# Patient Record
Sex: Female | Born: 1975 | Race: White | Hispanic: No | Marital: Married | Smoking: Never smoker
Health system: Southern US, Community
[De-identification: ages and names within clinical notes are randomized; demographics above are authoritative.]

## PROBLEM LIST (undated history)

## (undated) DIAGNOSIS — B019 Varicella without complication: Secondary | ICD-10-CM

## (undated) DIAGNOSIS — F329 Major depressive disorder, single episode, unspecified: Secondary | ICD-10-CM

## (undated) DIAGNOSIS — N390 Urinary tract infection, site not specified: Secondary | ICD-10-CM

## (undated) DIAGNOSIS — R7611 Nonspecific reaction to tuberculin skin test without active tuberculosis: Secondary | ICD-10-CM

## (undated) DIAGNOSIS — G43909 Migraine, unspecified, not intractable, without status migrainosus: Secondary | ICD-10-CM

## (undated) DIAGNOSIS — F32A Depression, unspecified: Secondary | ICD-10-CM

## (undated) DIAGNOSIS — E785 Hyperlipidemia, unspecified: Secondary | ICD-10-CM

## (undated) DIAGNOSIS — J45909 Unspecified asthma, uncomplicated: Secondary | ICD-10-CM

## (undated) DIAGNOSIS — E7849 Other hyperlipidemia: Principal | ICD-10-CM

## (undated) HISTORY — DX: Major depressive disorder, single episode, unspecified: F32.9

## (undated) HISTORY — DX: Depression, unspecified: F32.A

## (undated) HISTORY — DX: Other hyperlipidemia: E78.49

## (undated) HISTORY — DX: Migraine, unspecified, not intractable, without status migrainosus: G43.909

## (undated) HISTORY — DX: Hyperlipidemia, unspecified: E78.5

## (undated) HISTORY — DX: Varicella without complication: B01.9

## (undated) HISTORY — DX: Nonspecific reaction to tuberculin skin test without active tuberculosis: R76.11

## (undated) HISTORY — DX: Urinary tract infection, site not specified: N39.0

## (undated) HISTORY — PX: HERNIA REPAIR: SHX51

## (undated) HISTORY — DX: Unspecified asthma, uncomplicated: J45.909

---
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
## 2016-03-26 LAB — HEPATIC FUNCTION PANEL
ALK PHOS: 52 (ref 25–125)
ALT: 25 (ref 7–35)
AST: 16 (ref 13–35)
Bilirubin, Total: 0.6

## 2016-03-26 LAB — LIPID PANEL
CHOLESTEROL: 295 — AB (ref 0–200)
HDL: 50 (ref 35–70)
LDL CALC: 219
TRIGLYCERIDES: 132 (ref 40–160)

## 2016-03-26 LAB — CBC AND DIFFERENTIAL
HEMATOCRIT: 39 (ref 36–46)
HEMOGLOBIN: 13.7 (ref 12.0–16.0)
Platelets: 327 (ref 150–399)

## 2016-03-26 LAB — BASIC METABOLIC PANEL
BUN: 17 (ref 4–21)
Creatinine: 0.8 (ref 0.5–1.1)
Glucose: 88
Potassium: 4.6 (ref 3.4–5.3)
Sodium: 140 (ref 137–147)

## 2016-03-26 LAB — HM PAP SMEAR: HM Pap smear: NORMAL

## 2016-03-26 LAB — TSH: TSH: 2.25 (ref 0.41–5.90)

## 2017-09-08 LAB — LIPID PANEL
Cholesterol: 263 — AB (ref 0–200)
HDL: 44 (ref 35–70)
LDL Cholesterol: 191
LDL/HDL RATIO: 6
Triglycerides: 140 (ref 40–160)

## 2017-09-08 LAB — BASIC METABOLIC PANEL: Glucose: 93

## 2017-09-28 ENCOUNTER — Ambulatory Visit (INDEPENDENT_AMBULATORY_CARE_PROVIDER_SITE_OTHER): Payer: 59 | Admitting: Family Medicine

## 2017-09-28 ENCOUNTER — Encounter: Payer: Self-pay | Admitting: *Deleted

## 2017-09-28 ENCOUNTER — Encounter: Payer: Self-pay | Admitting: Family Medicine

## 2017-09-28 VITALS — BP 88/59 | HR 61 | Temp 98.3°F | Resp 20 | Ht 64.0 in | Wt 182.0 lb

## 2017-09-28 DIAGNOSIS — F329 Major depressive disorder, single episode, unspecified: Secondary | ICD-10-CM

## 2017-09-28 DIAGNOSIS — Z0283 Encounter for blood-alcohol and blood-drug test: Secondary | ICD-10-CM | POA: Diagnosis not present

## 2017-09-28 DIAGNOSIS — E669 Obesity, unspecified: Secondary | ICD-10-CM

## 2017-09-28 DIAGNOSIS — Z0289 Encounter for other administrative examinations: Secondary | ICD-10-CM

## 2017-09-28 DIAGNOSIS — Z79899 Other long term (current) drug therapy: Secondary | ICD-10-CM

## 2017-09-28 DIAGNOSIS — F419 Anxiety disorder, unspecified: Secondary | ICD-10-CM | POA: Diagnosis not present

## 2017-09-28 DIAGNOSIS — F32A Depression, unspecified: Secondary | ICD-10-CM | POA: Insufficient documentation

## 2017-09-28 MED ORDER — ALPRAZOLAM 0.25 MG PO TABS: 0.2500 mg | ORAL_TABLET | Freq: Two times a day (BID) | ORAL | 1 refills | Status: AC | PRN

## 2017-09-28 MED ORDER — PAROXETINE HCL 20 MG PO TABS: 20.0000 mg | ORAL_TABLET | Freq: Every day | ORAL | 1 refills | Status: AC

## 2017-09-28 NOTE — Progress Notes (Signed)
Patient ID: Jenny Lopez, female  DOB: September 17, 1975, 42 y.o.   MRN: [id number] Patient Care Team    Relationship Specialty Notifications Start End  Natalia Leatherwood, DO PCP - General Family Medicine  09/28/17     Chief Complaint  Patient presents with  . Establish Care    Subjective:  Jenny Lopez is a 42 y.o.  female present for new patient establishment. All past medical history, surgical history, allergies, family history, immunizations, medications and social history were obtained and entered in the electronic medical record today. All recent labs, ED visits and hospitalizations within the last year were reviewed.  Moved from [place], [place]. She is a Engineer, civil (consulting).   Depression/anxiety: Patient reports she has had depression with anxiety since she was a child.  She suffered from separation anxiety as a kid.  In 2005 her anxiety returned and she had panic attacks.  She reports she was started on Paxil and Xanax at that time.  She also reports trying Lexapro and Ativan at different times.  Did not feel the Ativan was helpful.  She reports she has been between 20 mg and 40 mg of Paxil.  Her trigger time seem to be around the Christmas holidays.  She reports she does not use the Xanax daily.  Mood disorder screening completed today, negative.  Depression screen [facility] 2/9 09/28/2017  Decreased Interest 0  Down, Depressed, Hopeless 0  PHQ - 2 Score 0  Altered sleeping 0  Tired, decreased energy 1  Change in appetite 1  Feeling bad or failure about yourself  0  Trouble concentrating 0  Moving slowly or fidgety/restless 0  Suicidal thoughts 0  PHQ-9 Score 2   GAD 7 : Generalized Anxiety Score 09/28/2017  Nervous, Anxious, on Edge 1  Control/stop worrying 0  Worry too much - different things 0  Trouble relaxing 0  Restless 0  Easily annoyed or irritable 0  Afraid - awful might happen 0  Total GAD 7 Score 1    Current Exercise Habits: The patient does not participate in  regular exercise at present Exercise limited by: None identified No flowsheet data found.   There is no immunization history on file for this patient.  No exam data present  Past Medical History:  Diagnosis Date  . Asthma    Childhood  . Chicken pox   . Depression   . Frequent UTI   . Hyperlipidemia   . Migraines   . Positive PPD, treated 1998   Of skin test, chest x-ray normal.  Pleated six-month prophylactic treatment with INH    Allergies  Allergen Reactions  . Neosporin [Neomycin-Bacitracin Zn-Polymyx] Hives  . Sulfa Antibiotics Hives   Past Surgical History:  Procedure Laterality Date  . HERNIA REPAIR     childhood   Family History  Problem Relation Age of Onset  . Arthritis Mother   . Depression Mother   . Diabetes Mother   . Hyperlipidemia Mother   . Hypertension Mother   . AAA (abdominal aortic aneurysm) Mother   . Arthritis Father   . Diabetes Father   . Heart disease Father   . Hyperlipidemia Father   . Hypertension Father   . Stroke Father   . Diabetes Brother   . Drug abuse Brother   . Heart disease Brother   . Arthritis Maternal Grandmother   . Breast cancer Maternal Grandmother   . Diabetes Maternal Grandmother   . Heart disease Maternal Grandmother   . Hyperlipidemia Maternal  Grandmother   . Hypertension Maternal Grandmother   . Miscarriages / Stillbirths Maternal Grandmother   . Arthritis Maternal Grandfather   . Heart disease Maternal Grandfather   . Hyperlipidemia Maternal Grandfather   . Stroke Maternal Grandfather   . Stroke Paternal Grandmother   . Diabetes Paternal Grandmother   . Heart disease Paternal Grandmother   . Hyperlipidemia Paternal Grandmother   . Heart disease Paternal Grandfather   . Alcohol abuse Brother   . Depression Brother   . Drug abuse Brother   . Cancer Brother    Social History   Socioeconomic History  . Marital status: Married    Spouse name: Not on file  . Number of children: Not on file  . Years  of education: Not on file  . Highest education level: Not on file  Occupational History  . Occupation: Nurse  Social Needs  . Financial resource strain: Not on file  . Food insecurity:    Worry: Not on file    Inability: Not on file  . Transportation needs:    Medical: Not on file    Non-medical: Not on file  Tobacco Use  . Smoking status: Never Smoker  . Smokeless tobacco: Never Used  Substance and Sexual Activity  . Alcohol use: Not Currently    Frequency: Never  . Drug use: Never  . Sexual activity: Yes    Partners: Male    Comment: Married  Lifestyle  . Physical activity:    Days per week: Not on file    Minutes per session: Not on file  . Stress: Not on file  Relationships  . Social connections:    Talks on phone: Not on file    Gets together: Not on file    Attends religious service: Not on file    Active member of club or organization: Not on file    Attends meetings of clubs or organizations: Not on file    Relationship status: Not on file  . Intimate partner violence:    Fear of current or ex partner: Not on file    Emotionally abused: Not on file    Physically abused: Not on file    Forced sexual activity: Not on file  Other Topics Concern  . Not on file  Social History Narrative   Married.  One child.   Works as an Charity fundraiser.   She is herbal remedies.   Smoke alarm in the home.   Wears her seatbelt.   Feels safe in her relationships.   Allergies as of 09/28/2017      Reactions   Neosporin [neomycin-bacitracin Zn-polymyx] Hives   Sulfa Antibiotics Hives      Medication List        Accurate as of 09/28/17 11:59 PM. Always use your most recent med list.          ALPRAZolam 0.25 MG tablet Commonly known as:  XANAX Take 1 tablet (0.25 mg total) by mouth 2 (two) times daily as needed.   dicyclomine 10 MG capsule Commonly known as:  BENTYL Take 10 mg by mouth as needed for spasms.   multivitamin tablet Take 1 tablet by mouth daily.   PARoxetine 20  MG tablet Commonly known as:  PAXIL Take 1 tablet (20 mg total) by mouth daily.   PROBIOTIC ADVANCED PO Take 3 tablets by mouth daily.   Vitamin D3 1000 units Caps Take 1 capsule by mouth daily.       All past medical history, surgical  history, allergies, family history, immunizations andmedications were updated in the EMR today and reviewed under the history and medication portions of their EMR.     Patient was never admitted.   ROS: 14 pt review of systems performed and negative (unless mentioned in an HPI)  Objective: BP (!) 88/59 (BP Location: Right Arm, Patient Position: Sitting, Cuff Size: Normal)   Pulse 61   Temp 98.3 F (36.8 C)   Resp 20   Ht  (1.626 m)   Wt 182 lb (82.6 kg)   LMP 09/25/2017   SpO2 97%   BMI 31.24 kg/m  Gen: Afebrile. No acute distress. Nontoxic in appearance, well-developed, well-nourished, pleasant Caucasian female. HENT: AT. [place]. MMM Eyes:Pupils Equal Round Reactive to light, Extraocular movements intact,  Conjunctiva without redness, discharge or icterus. CV: RRR no murmur, no edema Chest: CTAB, no wheeze, rhonchi or crackles.  Abd: Soft.NTND. BS present.  Skin: Warm and well-perfused. Skin intact. Neuro/Msk:  Normal gait. PERLA. EOMi. Alert. Oriented x3.   Psych: Normal affect, dress and demeanor. Normal speech. Normal thought content and judgment.   Assessment/plan: Breshae Belcher is a 42 y.o. female present for establish care. Anxiety and depression -Discussed patient's therapy in detail.  She feels she would like to decrease her Paxil to 20 mg daily.  This was completed today with refills. -Discussed controlled substance with patient today.  I agreed to provide/continue low dose Xanax for her, since she has been on this for quite some time.  Xanax will not be increased, maximal SSRI therapy will be achieved before increasing benzodiazepine. -Patient signed a controlled substance contract today.  [place] controlled substance  database was reviewed and appropriate.  Urine drug screen was completed. - Pain Mgmt, Profile 8 w/Conf, U Follow-up 6 months   Encounter for drug screening/Benzodiazepine contract exists Drug screen completed secondary to use of controlled substance.  Obesity (BMI 30-39.9) Diet and exercise modifications.  Return in about 6 months (around 03/30/2018).   Note is dictated utilizing voice recognition software. Although note has been proof read prior to signing, occasional typographical errors still can be missed. If any questions arise, please do not hesitate to call for verification.  Electronically signed by: Felix Pacini, DO [facility]

## 2017-09-28 NOTE — Patient Instructions (Addendum)
It was nice to me you today.  I have refilled your paxil today at the 20 dose--> you need to take daily.    Controlled substance require face to face encounter every 6 months with this provider only  Please help [place] help you:  We are honored you have chosen [facility] for your Primary Care home. Below you will find basic instructions that you may need to access in the future. Please help [place] help you by reading the instructions, which cover many of the frequent questions we experience.   Prescription refills and request:  -In order to allow more efficient response time, please call your pharmacy for all refills. They will forward the request electronically to [place]. This allows for the quickest possible response. Request left on a nurse line can take longer to refill, since these are checked as time allows between office patients and other phone calls.  - refill request can take up to 3-5 working days to complete.  - If request is sent electronically and request is appropiate, it is usually completed in 1-2 business days.  - all patients will need to be seen routinely for all chronic medical conditions requiring prescription medications (see follow-up below). If you are overdue for follow up on your condition, you will be asked to make an appointment and we will call in enough medication to cover you until your appointment (up to 30 days).  - all controlled substances will require a face to face visit to request/refill.  - if you desire your prescriptions to go through a new pharmacy, and have an active script at original pharmacy, you will need to call your pharmacy and have scripts transferred to new pharmacy. This is completed between the pharmacy locations and not by your provider.    Results: If any images or labs were ordered, it can take up to 1 week to get results depending on the test ordered and the lab/facility running and resulting the test. - Normal or stable results, which do not  need further discussion, may be released to your mychart immediately with attached note to you. A call may not be generated for normal results. Please make certain to sign up for mychart. If you have questions on how to activate your mychart you can call the front office.  - If your results need further discussion, our office will attempt to contact you via phone, and if unable to reach you after 2 attempts, we will release your abnormal result to your mychart with instructions.  - All results will be automatically released in mychart after 1 week.  - Your provider will provide you with explanation and instruction on all relevant material in your results. Please keep in mind, results and labs may appear confusing or abnormal to the untrained eye, but it does not mean they are actually abnormal for you personally. If you have any questions about your results that are not covered, or you desire more detailed explanation than what was provided, you should make an appointment with your provider to do so.   Our office handles many outgoing and incoming calls daily. If we have not contacted you within 1 week about your results, please check your mychart to see if there is a message first and if not, then contact our office.  In helping with this matter, you help decrease call volume, and therefore allow [place] to be able to respond to patients needs more efficiently.   Acute office visits (sick visit):  An acute visit is intended for a new problem and are scheduled in shorter time slots to allow schedule openings for patients with new problems. This is the appropriate visit to discuss a new problem. In order to provide you with excellent quality medical care with proper time for you to explain your problem, have an exam and receive treatment with instructions, these appointments should be limited to one new problem per visit. If you experience a new problem, in which you desire to be addressed, please make an acute  office visit, we save openings on the schedule to accommodate you. Please do not save your new problem for any other type of visit, let us take care of it properly and quickly for you.   Follow up visits:  Depending on your condition(s) your provider will need to see you routinely in order to provide you with quality care and prescribe medication(s). Most chronic conditions (Example: hypertension, Diabetes, depression/anxiety... etc), require visits a couple times a year. Your provider will instruct you on proper follow up for your personal medical conditions and history. Please make certain to make follow up appointments for your condition as instructed. Failing to do so could result in lapse in your medication treatment/refills. If you request a refill, and are overdue to be seen on a condition, we will always provide you with a 30 day script (once) to allow you time to schedule.    Medicare wellness (well visit): - we have a wonderful Nurse Selena Batten), that will meet with you and provide you will yearly medicare wellness visits. These visits should occur yearly (can not be scheduled less than 1 calendar year apart) and cover preventive health, immunizations, advance directives and screenings you are entitled to yearly through your medicare benefits. Do not miss out on your entitled benefits, this is when medicare will pay for these benefits to be ordered for you.  These are strongly encouraged by your provider and is the appropriate type of visit to make certain you are up to date with all preventive health benefits. If you have not had your medicare wellness exam in the last 12 months, please make certain to schedule one by calling the office and schedule your medicare wellness with Selena Batten as soon as possible.   Yearly physical (well visit):  - Adults are recommended to be seen yearly for physicals. Check with your insurance and date of your last physical, most insurances require one calendar year between  physicals. Physicals include all preventive health topics, screenings, medical exam and labs that are appropriate for gender/age and history. You may have fasting labs needed at this visit. This is a well visit (not a sick visit), new problems should not be covered during this visit (see acute visit).  - Pediatric patients are seen more frequently when they are younger. Your provider will advise you on well child visit timing that is appropriate for your their age. - This is not a medicare wellness visit. Medicare wellness exams do not have an exam portion to the visit. Some medicare companies allow for a physical, some do not allow a yearly physical. If your medicare allows a yearly physical you can schedule the medicare wellness with our nurse Selena Batten and have your physical with your provider after, on the same day. Please check with insurance for your full benefits.   Late Policy/No Shows:  - all new patients should arrive 15-30 minutes earlier than appointment to allow [place] time  to  obtain all personal demographics,  insurance information and for you to complete office paperwork. - All established patients should arrive 10-15 minutes earlier than appointment time to update all information and be checked in .  - In our best efforts to run on time, if you are late for your appointment you will be asked to either reschedule or if able, we will work you back into the schedule. There will be a wait time to work you back in the schedule,  depending on availability.  - If you are unable to make it to your appointment as scheduled, please call 24 hours ahead of time to allow [place] to fill the time slot with someone else who needs to be seen. If you do not cancel your appointment ahead of time, you may be charged a no show fee.

## 2017-09-30 ENCOUNTER — Encounter: Payer: Self-pay | Admitting: Family Medicine

## 2017-09-30 DIAGNOSIS — E669 Obesity, unspecified: Secondary | ICD-10-CM | POA: Insufficient documentation

## 2017-10-01 LAB — PAIN MGMT, PROFILE 8 W/CONF, U
6 Acetylmorphine: NEGATIVE ng/mL (ref <10)
ALPHAHYDROXYALPRAZOLAM: 25 ng/mL — AB (ref <25)
AMINOCLONAZEPAM: NEGATIVE ng/mL (ref <25)
Alcohol Metabolites: NEGATIVE ng/mL (ref <500)
Alphahydroxymidazolam: NEGATIVE ng/mL (ref <50)
Alphahydroxytriazolam: NEGATIVE ng/mL (ref <50)
Amphetamines: NEGATIVE ng/mL (ref <500)
BENZODIAZEPINES: POSITIVE ng/mL — AB (ref <100)
BUPRENORPHINE, URINE: NEGATIVE ng/mL (ref <5)
Cocaine Metabolite: NEGATIVE ng/mL (ref <150)
Hydroxyethylflurazepam: NEGATIVE ng/mL (ref <50)
Lorazepam: NEGATIVE ng/mL (ref <50)
MDMA: NEGATIVE ng/mL (ref <500)
Marijuana Metabolite: NEGATIVE ng/mL (ref <20)
Nordiazepam: NEGATIVE ng/mL (ref <50)
OXIDANT: NEGATIVE ug/mL (ref <200)
Opiates: NEGATIVE ng/mL (ref <100)
Oxazepam: NEGATIVE ng/mL (ref <50)
Oxycodone: NEGATIVE ng/mL (ref <100)
PH: 6.68 (ref 4.5–9.0)
TEMAZEPAM: NEGATIVE ng/mL (ref <50)

## 2017-12-15 ENCOUNTER — Encounter: Payer: Self-pay | Admitting: Family Medicine

## 2017-12-20 ENCOUNTER — Encounter: Payer: Self-pay | Admitting: Family Medicine

## 2018-01-10 ENCOUNTER — Ambulatory Visit
Admission: RE | Admit: 2018-01-10 | Discharge: 2018-01-10 | Disposition: A | Discharge status: Home / Self Care | Payer: 59 | Source: Ambulatory Visit | Attending: Internal Medicine | Admitting: Internal Medicine

## 2018-01-10 ENCOUNTER — Other Ambulatory Visit: Payer: Self-pay | Admitting: Internal Medicine

## 2018-01-10 DIAGNOSIS — R7611 Nonspecific reaction to tuberculin skin test without active tuberculosis: Secondary | ICD-10-CM

## 2018-02-15 ENCOUNTER — Other Ambulatory Visit: Payer: Self-pay | Admitting: Internal Medicine

## 2018-02-15 DIAGNOSIS — Z1231 Encounter for screening mammogram for malignant neoplasm of breast: Secondary | ICD-10-CM

## 2018-02-28 ENCOUNTER — Ambulatory Visit
Admission: RE | Admit: 2018-02-28 | Discharge: 2018-02-28 | Disposition: A | Discharge status: Home / Self Care | Payer: 59 | Source: Ambulatory Visit | Attending: Internal Medicine | Admitting: Internal Medicine

## 2018-02-28 DIAGNOSIS — Z1231 Encounter for screening mammogram for malignant neoplasm of breast: Secondary | ICD-10-CM

## 2018-03-02 ENCOUNTER — Ambulatory Visit (INDEPENDENT_AMBULATORY_CARE_PROVIDER_SITE_OTHER): Payer: 59 | Admitting: Cardiovascular Disease

## 2018-03-02 ENCOUNTER — Encounter: Payer: Self-pay | Admitting: Cardiovascular Disease

## 2018-03-02 VITALS — BP 118/68 | HR 69 | Ht 64.0 in | Wt 177.0 lb

## 2018-03-02 DIAGNOSIS — Z136 Encounter for screening for cardiovascular disorders: Secondary | ICD-10-CM | POA: Diagnosis not present

## 2018-03-02 DIAGNOSIS — E7849 Other hyperlipidemia: Secondary | ICD-10-CM | POA: Insufficient documentation

## 2018-03-02 HISTORY — DX: Other hyperlipidemia: E78.49

## 2018-03-02 NOTE — Progress Notes (Signed)
Cardiology Office Note   Date:  03/05/2018   ID:  Jenny Lopez, DOB Dec 18, 1975, MRN [id number]  PCP:  Jenny Ranch, MD  Cardiologist:   Jenny Si, MD   No chief complaint on file.     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  Jenny Lopez has known that Jenny Lopez had hyperlipidemia since her 39s. Jenny Lopez was able to control it with diet and exercise in her 20s.  Jenny Lopez thinks here LDL was in the low 100s.  However, recently Jenny Lopez hasn't been exercising regularly or following any particular diet.  Jenny Lopez has ben trying to limit sweets.  Jenny Lopez previously participated in [employer], swam and lifted weights.  Jenny Lopez has no exertional chest pain or shortness of breath.  Jenny Lopez hasn't experienced any lower extremity edema, orthopnea or PND.  Jenny Lopez has an extensive family history of atherosclerotic disease.  Her mother smoked but has an ascending aorta aneurysm, abdominal aorta aneurysm, and occlusion of the subclavian artery. A maternal aunt survived a ruptured AAA.  Her father cad carotid disease , heart disease and a stroke.  Jenny Lopez has recommended that Jenny Lopez be on a statin given her FH and extensive family history of CV disease but Jenny Lopez has declined.    Past Medical History:  Diagnosis Date  . Asthma    Childhood  . Chicken pox   . Depression   . Familial hyperlipidemia 03/02/2018  . Frequent UTI   . Hyperlipidemia   . Migraines   . Positive PPD, treated 1998   Of skin test, chest x-ray normal.  Pleated six-month prophylactic treatment with INH     Past Surgical History:  Procedure Laterality Date  . HERNIA REPAIR     childhood     Current Outpatient Medications  Medication Sig Dispense Refill  . ALPRAZolam (XANAX) 0.25 MG tablet Take 1 tablet (0.25 mg total) by mouth 2 (two) times daily as needed. 30 tablet 1  . Cholecalciferol (VITAMIN D3) 1000 units CAPS Take 1  capsule by mouth daily.    Marland Kitchen dicyclomine (BENTYL) 10 MG capsule Take 10 mg by mouth as needed for spasms.    . Multiple Vitamin (MULTIVITAMIN) tablet Take 1 tablet by mouth daily.    Marland Kitchen PARoxetine (PAXIL) 20 MG tablet Take 1 tablet (20 mg total) by mouth daily. 90 tablet 1  . Probiotic Product (PROBIOTIC ADVANCED PO) Take 3 tablets by mouth daily.     No current facility-administered medications for this visit.     Allergies:   Neosporin [neomycin-bacitracin zn-polymyx] and Sulfa antibiotics    Social History:  The patient  reports that Jenny Lopez has never smoked. Jenny Lopez has never used smokeless tobacco. Jenny Lopez reports that Jenny Lopez drank alcohol. Jenny Lopez reports that Jenny Lopez does not use drugs.   Family History:  The patient's family history includes AAA (abdominal aortic aneurysm) in her mother; Alcohol abuse in her brother; Arthritis in her father, maternal grandfather, maternal grandmother, and mother; Breast cancer in her maternal grandmother; Cancer in her brother; Depression in her brother and mother; Diabetes in her brother, father, maternal grandmother, mother, and paternal grandmother; Drug abuse in her brother and brother; Heart disease in her brother, father, maternal grandfather, maternal grandmother, paternal grandfather, and paternal grandmother; Hyperlipidemia in her father, maternal grandfather, maternal grandmother, mother, and paternal grandmother; Hypertension in her father, maternal grandmother, and mother; Miscarriages / [place] in her maternal grandmother;  Stroke in her father, maternal grandfather, and paternal grandmother.    ROS:  Please see the history of present illness.   Otherwise, review of systems are positive for none.   All other systems are reviewed and negative.    PHYSICAL EXAM: VS:  BP 118/68   Pulse 69   Ht 5\' 4"  (1.626 m)   Wt 177 lb (80.3 kg)   LMP 02/14/2018   SpO2 98%   BMI 30.38 kg/m  , BMI Body mass index is 30.38 kg/m. GENERAL:  Well appearing HEENT:  Pupils  equal round and reactive, fundi not visualized, oral mucosa unremarkable NECK:  No jugular venous distention, waveform within normal limits, carotid upstroke brisk and symmetric, no bruits LUNGS:  Clear to auscultation bilaterally HEART:  RRR.  PMI not displaced or sustained,S1 and S2 within normal limits, no S3, no S4, no clicks, no rubs, no murmurs ABD:  Flat, positive bowel sounds normal in frequency in pitch, no bruits, no rebound, no guarding, no midline pulsatile mass, no hepatomegaly, no splenomegaly EXT:  2 plus pulses throughout, no edema, no cyanosis no clubbing SKIN:  No rashes no nodules NEURO:  Cranial nerves II through XII grossly intact, motor grossly intact throughout PSYCH:  Cognitively intact, oriented to person place and time   EKG:  EKG is ordered today. The ekg ordered today demonstrates sinus rhythm.  Rate 71 bpm.     Recent Labs: No results found for requested labs within last 8760 hours.   01/10/2018: Total cholesterol 258, triglycerides 121, HDL 34, LDL 200 Potassium 4.5, creatinine 0.7 Hemoglobin 13.5 TSH 1.26  Lipid Panel    Component Value Date/Time   CHOL 263 (A) 09/08/2017   TRIG 140 09/08/2017   HDL 44 09/08/2017   LDLCALC 191 09/08/2017      Wt Readings from Last 3 Encounters:  03/02/18 177 lb (80.3 kg)  09/28/17 182 lb (82.6 kg)      ASSESSMENT AND PLAN:  # Familial hyperlipidemia:  Jenny Lopez has an LDL of 200 and many family members with extensive premature atherosclerotic disease.  Jenny Lopez has been hesitant to try statins due to concern about developing diabetes.  We discussed the pros and cons extensively.  We discussed the comparative risk of developing diabetes from a statin with the risk of MI, stroke of death.  I am in agreement with Jenny Lopez that guidelines would absolutely recommend Jenny Lopez be on a statin for lipid lowering.  Her LDL should be at least <100.  I doubt that Jenny Lopez can do this with diet and exercise alone and this would  require consistent effort over time.  Jenny Lopez has neither been exercising nor dieting.  Jenny Lopez is committed to working on this for the next 4 months and we will reassess.  Repeat lipids/CMP   # FH AAA: Jenny Lopez only risk factors are hyperlipidemia and family history.  Jenny Lopez wants to get a triple screen (abdominal ultrasound, carotid Dopplers and ABIs).  Lipid management as above.   Current medicines are reviewed at length with the patient today.  The patient does not have concerns regarding medicines.  The following changes have been made:  no change    Disposition:   FU with Kwamaine Cuppett C. Duke Salvia, MD, [facility] in 4 months.   Time spent: 45 minutes-Greater than 50% of this time was spent in counseling, explanation of diagnosis, planning of further management, and coordination of care.      Signed, Buddy Loeffelholz C. Duke Salvia, MD, [facility]  03/05/2018 6:47 PM  [facility]

## 2018-03-02 NOTE — Patient Instructions (Addendum)
Medication Instructions:  Your physician recommends that you continue on your current medications as directed. Please refer to the Current Medication list given to you today.  Labwork: FASTING LP/CMET FEW DAYS PRIOR TO FOLLOW UP   Testing/Procedures: VASCULAR SCREENING $150 OUT OF POCKET   Follow-Up: Your physician recommends that you schedule a follow-up appointment in: 3 MONTHS  If you need a refill on your cardiac medications before your next appointment, please call your pharmacy.

## 2018-03-05 ENCOUNTER — Encounter: Payer: Self-pay | Admitting: Cardiovascular Disease

## 2018-03-07 ENCOUNTER — Ambulatory Visit (HOSPITAL_COMMUNITY): Payer: 59

## 2018-03-08 ENCOUNTER — Ambulatory Visit (HOSPITAL_COMMUNITY)
Admission: RE | Admit: 2018-03-08 | Discharge: 2018-03-08 | Disposition: A | Discharge status: Home / Self Care | Payer: 59 | Source: Ambulatory Visit | Attending: Cardiovascular Disease | Admitting: Cardiovascular Disease

## 2018-03-08 DIAGNOSIS — E7849 Other hyperlipidemia: Secondary | ICD-10-CM | POA: Insufficient documentation

## 2018-03-08 DIAGNOSIS — Z136 Encounter for screening for cardiovascular disorders: Secondary | ICD-10-CM | POA: Insufficient documentation

## 2018-05-26 ENCOUNTER — Ambulatory Visit: Payer: 59 | Admitting: Cardiovascular Disease

## 2019-01-27 ENCOUNTER — Other Ambulatory Visit: Payer: Self-pay | Admitting: Internal Medicine

## 2019-01-27 DIAGNOSIS — Z1231 Encounter for screening mammogram for malignant neoplasm of breast: Secondary | ICD-10-CM

## 2019-03-14 ENCOUNTER — Ambulatory Visit
Admission: RE | Admit: 2019-03-14 | Discharge: 2019-03-14 | Disposition: A | Discharge status: Home / Self Care | Payer: 59 | Source: Ambulatory Visit | Attending: Internal Medicine | Admitting: Internal Medicine

## 2019-03-14 ENCOUNTER — Other Ambulatory Visit: Payer: Self-pay

## 2019-03-14 DIAGNOSIS — Z1231 Encounter for screening mammogram for malignant neoplasm of breast: Secondary | ICD-10-CM

## 2019-09-13 IMAGING — MG DIGITAL SCREENING BILATERAL MAMMOGRAM WITH TOMO AND CAD
8 series · 9 of 24 positions shown · non-contrast
Comparison: None.

CLINICAL DATA: Screening.

EXAM:
DIGITAL SCREENING BILATERAL MAMMOGRAM WITH TOMO AND CAD

[L CC synth-2D]
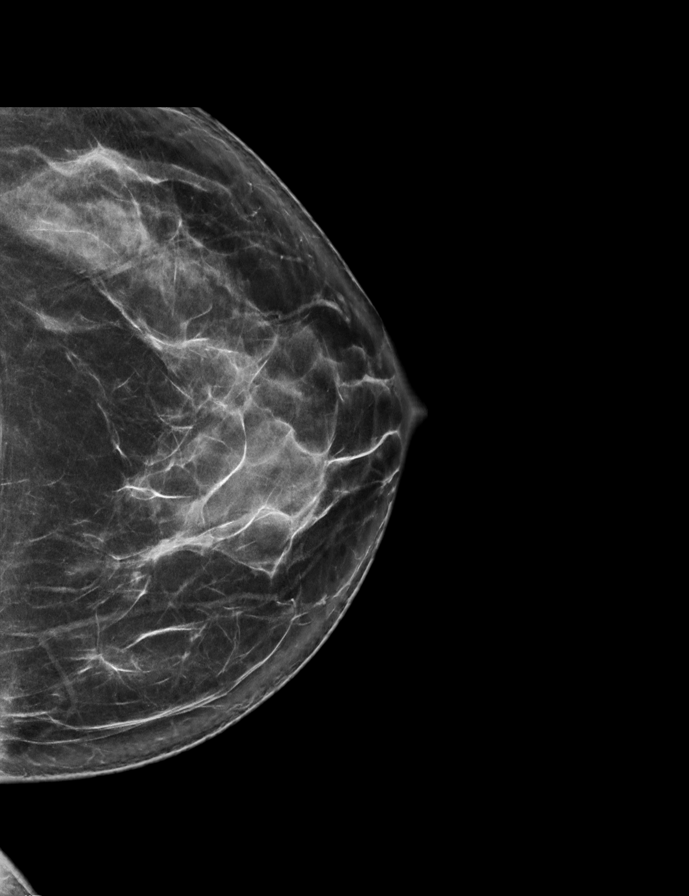

[R MLO synth-2D]
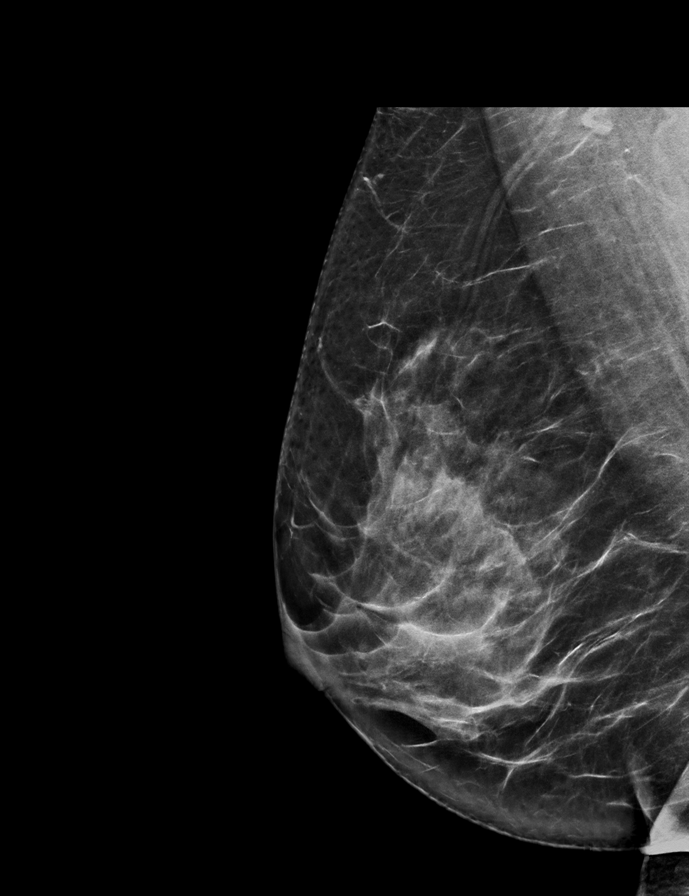

[L MLO synth-2D]
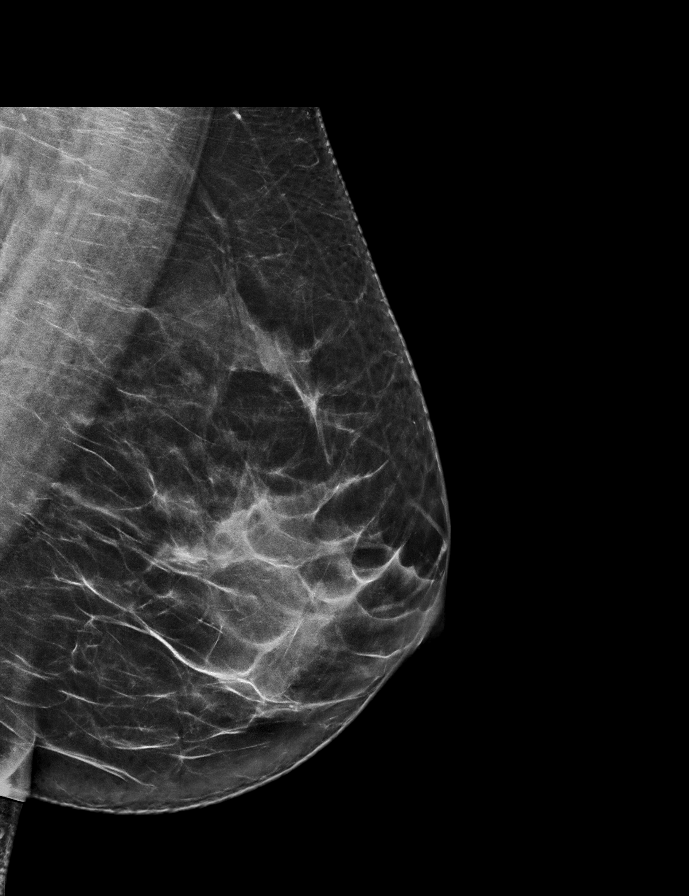

[R CC synth-2D]
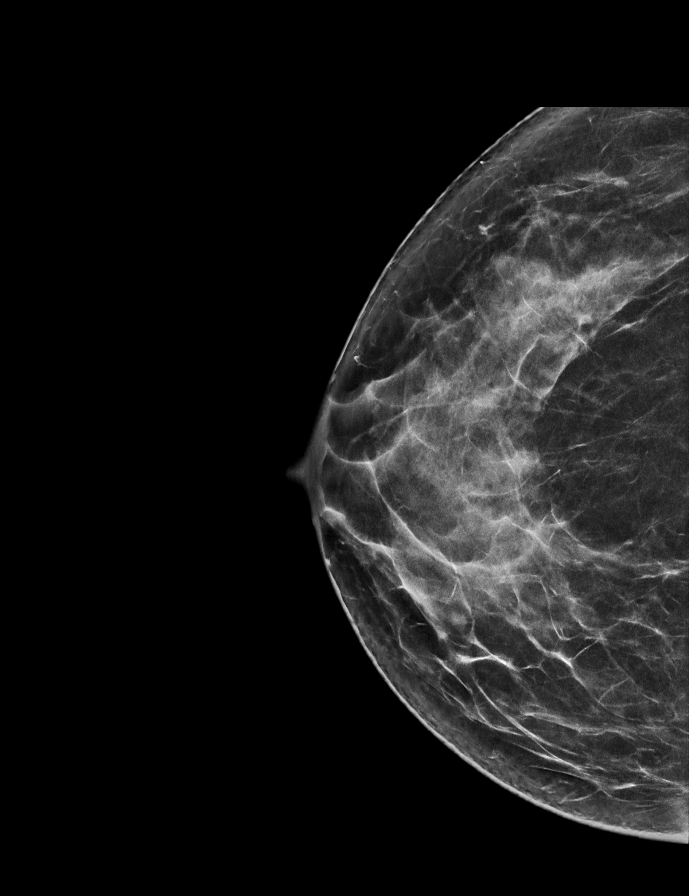

[R CC tomo · 2 of 68 frames shown]
[frame 22/68]
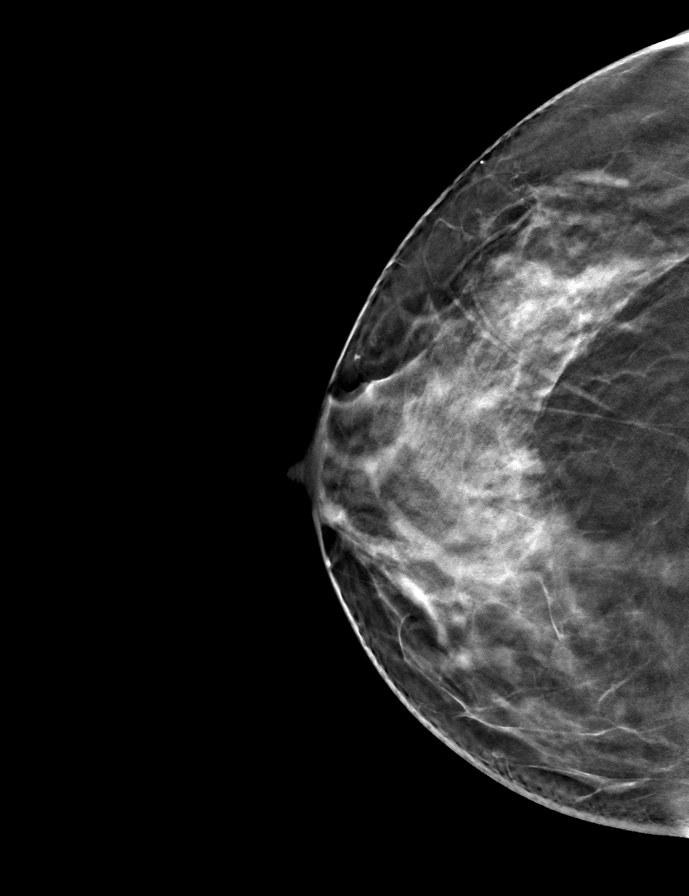
[frame 35/68]
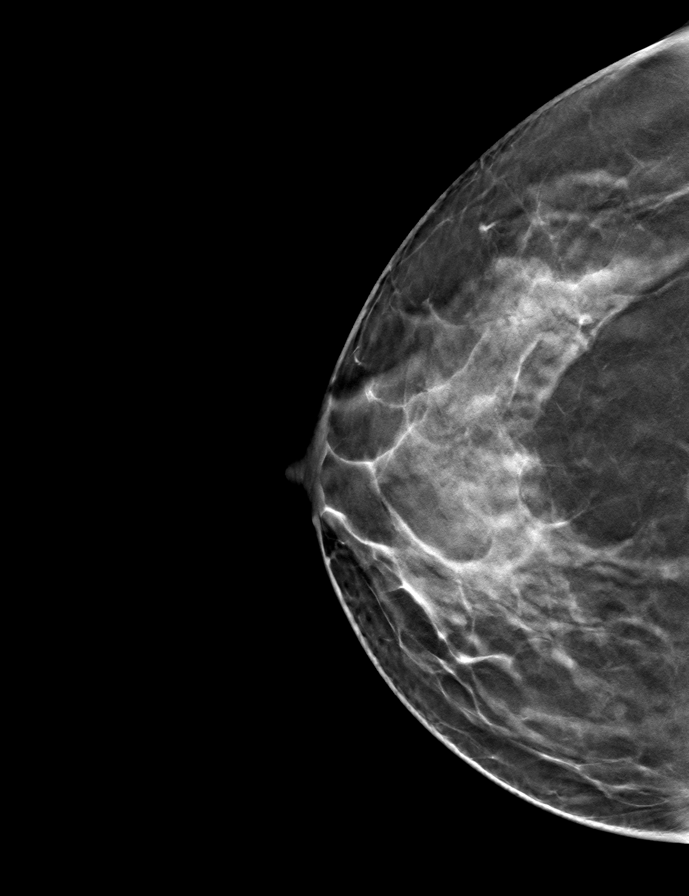

[L MLO tomo · tomo slice 37/74.0]
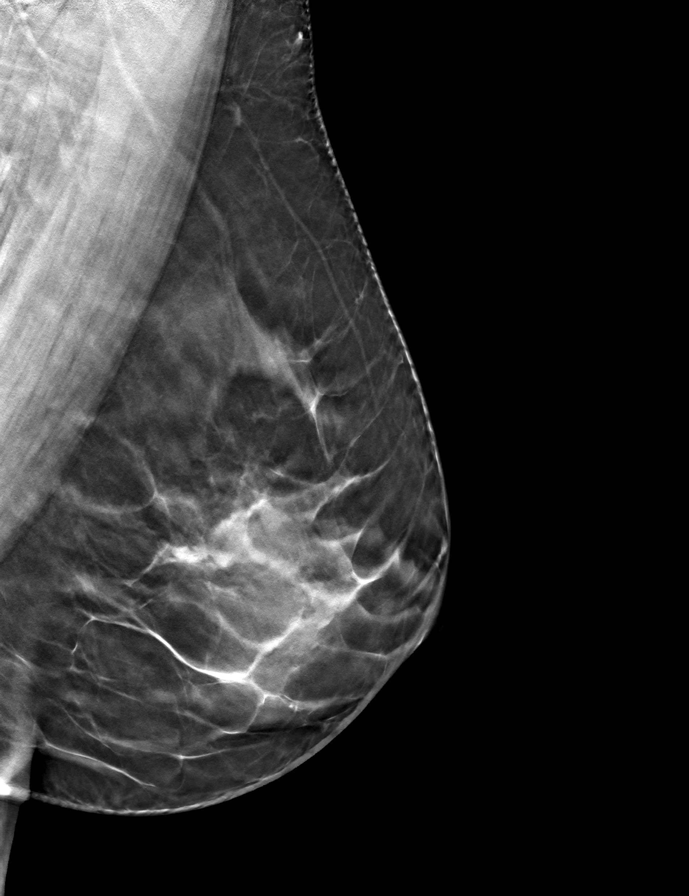

[R MLO tomo · tomo slice 39/76.0]
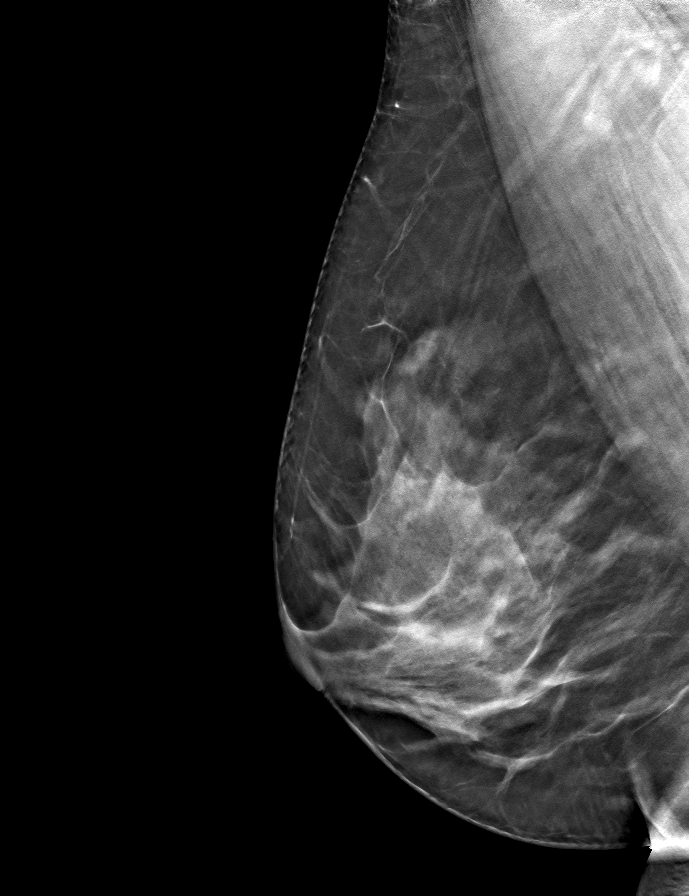

[L CC tomo · tomo slice 37/74.0]
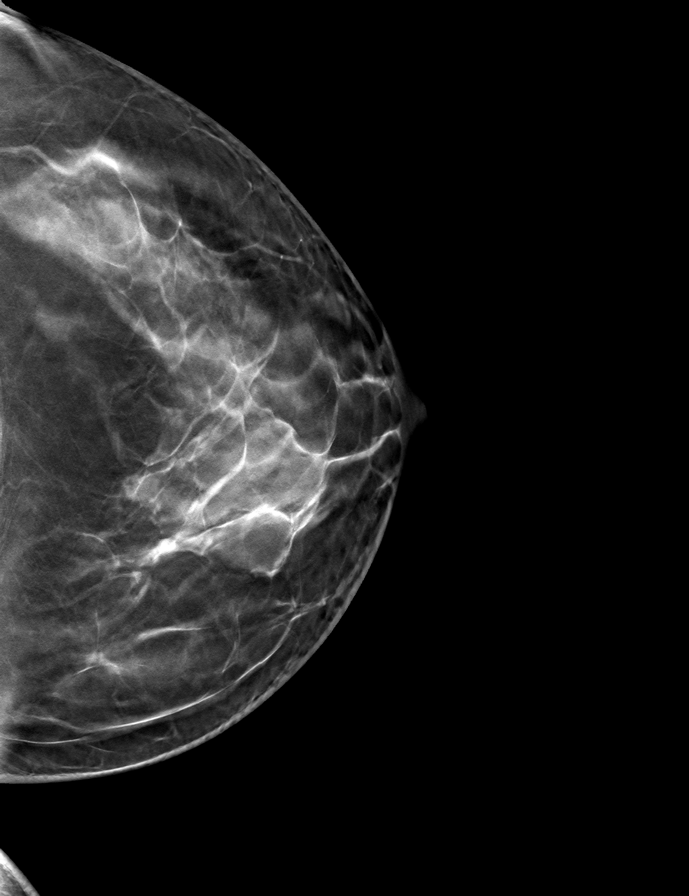

[9 of 24 positions shown; findings below may reference images not displayed]

ACR Breast Density Category c: The breast tissue is heterogeneously
dense, which may obscure small masses
FINDINGS: There are no findings suspicious for malignancy. Images were
processed with CAD.
IMPRESSION: No mammographic evidence of malignancy. A result letter of this
screening mammogram will be mailed directly to the patient.

RECOMMENDATION:
Screening mammogram in one year. (Code:EM-2-IHY)

BI-RADS CATEGORY  1: Negative.

## 2022-07-30 DIAGNOSIS — M4723 Other spondylosis with radiculopathy, cervicothoracic region: Secondary | ICD-10-CM | POA: Diagnosis not present

## 2022-07-30 DIAGNOSIS — M25521 Pain in right elbow: Secondary | ICD-10-CM | POA: Diagnosis not present

## 2022-07-30 DIAGNOSIS — M9907 Segmental and somatic dysfunction of upper extremity: Secondary | ICD-10-CM | POA: Diagnosis not present

## 2022-07-30 DIAGNOSIS — M9901 Segmental and somatic dysfunction of cervical region: Secondary | ICD-10-CM | POA: Diagnosis not present

## 2022-08-03 DIAGNOSIS — M9903 Segmental and somatic dysfunction of lumbar region: Secondary | ICD-10-CM | POA: Diagnosis not present

## 2022-08-03 DIAGNOSIS — M9907 Segmental and somatic dysfunction of upper extremity: Secondary | ICD-10-CM | POA: Diagnosis not present

## 2022-08-03 DIAGNOSIS — M9904 Segmental and somatic dysfunction of sacral region: Secondary | ICD-10-CM | POA: Diagnosis not present

## 2022-08-03 DIAGNOSIS — M9901 Segmental and somatic dysfunction of cervical region: Secondary | ICD-10-CM | POA: Diagnosis not present

## 2023-01-12 DIAGNOSIS — Z13220 Encounter for screening for lipoid disorders: Secondary | ICD-10-CM | POA: Diagnosis not present

## 2023-01-12 DIAGNOSIS — T63461A Toxic effect of venom of wasps, accidental (unintentional), initial encounter: Secondary | ICD-10-CM | POA: Diagnosis not present

## 2023-01-12 DIAGNOSIS — Z0000 Encounter for general adult medical examination without abnormal findings: Secondary | ICD-10-CM | POA: Diagnosis not present

## 2023-06-08 DIAGNOSIS — J1569 Pneumonia due to other gram-negative bacteria: Secondary | ICD-10-CM | POA: Diagnosis not present

## 2024-02-23 DIAGNOSIS — Z0000 Encounter for general adult medical examination without abnormal findings: Secondary | ICD-10-CM | POA: Diagnosis not present

## 2024-02-23 DIAGNOSIS — Z1329 Encounter for screening for other suspected endocrine disorder: Secondary | ICD-10-CM | POA: Diagnosis not present

## 2024-02-23 DIAGNOSIS — Z13220 Encounter for screening for lipoid disorders: Secondary | ICD-10-CM | POA: Diagnosis not present

## 2024-02-23 DIAGNOSIS — E669 Obesity, unspecified: Secondary | ICD-10-CM | POA: Diagnosis not present

## 2024-03-10 DIAGNOSIS — R5383 Other fatigue: Secondary | ICD-10-CM | POA: Diagnosis not present

## 2024-03-10 DIAGNOSIS — R509 Fever, unspecified: Secondary | ICD-10-CM | POA: Diagnosis not present

## 2024-03-10 DIAGNOSIS — W57XXXA Bitten or stung by nonvenomous insect and other nonvenomous arthropods, initial encounter: Secondary | ICD-10-CM | POA: Diagnosis not present
